# Patient Record
Sex: Female | Born: 1950 | Race: White | Hispanic: No | Marital: Married | Smoking: Never smoker
Health system: Southern US, Community
[De-identification: ages and names within clinical notes are randomized; demographics above are authoritative.]

## PROBLEM LIST (undated history)

## (undated) DIAGNOSIS — R112 Nausea with vomiting, unspecified: Secondary | ICD-10-CM

## (undated) DIAGNOSIS — M1990 Unspecified osteoarthritis, unspecified site: Secondary | ICD-10-CM

## (undated) DIAGNOSIS — I10 Essential (primary) hypertension: Secondary | ICD-10-CM

## (undated) DIAGNOSIS — E7800 Pure hypercholesterolemia, unspecified: Secondary | ICD-10-CM

## (undated) DIAGNOSIS — Z9889 Other specified postprocedural states: Secondary | ICD-10-CM

## (undated) DIAGNOSIS — C801 Malignant (primary) neoplasm, unspecified: Secondary | ICD-10-CM

## (undated) HISTORY — PX: MULTIPLE TOOTH EXTRACTIONS: SHX2053

## (undated) HISTORY — PX: COLONOSCOPY: SHX174

## (undated) HISTORY — PX: BREAST BIOPSY: SHX20

## (undated) HISTORY — PX: TUBAL LIGATION: SHX77

---

## 2017-07-29 ENCOUNTER — Other Ambulatory Visit: Payer: Self-pay | Admitting: Specialist

## 2017-07-29 DIAGNOSIS — R921 Mammographic calcification found on diagnostic imaging of breast: Secondary | ICD-10-CM

## 2017-08-03 ENCOUNTER — Ambulatory Visit
Admission: RE | Admit: 2017-08-03 | Discharge: 2017-08-03 | Disposition: A | Discharge status: Home / Self Care | Payer: Medicare Other | Source: Ambulatory Visit | Attending: Specialist | Admitting: Specialist

## 2017-08-03 DIAGNOSIS — R921 Mammographic calcification found on diagnostic imaging of breast: Secondary | ICD-10-CM

## 2017-08-07 ENCOUNTER — Other Ambulatory Visit: Payer: Self-pay | Admitting: Surgery

## 2017-08-07 ENCOUNTER — Ambulatory Visit: Payer: Self-pay | Admitting: Surgery

## 2017-08-07 DIAGNOSIS — D0512 Intraductal carcinoma in situ of left breast: Secondary | ICD-10-CM

## 2017-08-07 NOTE — H&P (Signed)
Gabriela Hill 08/07/2017 9:17 AM Location: [facility] Patient #: [id number] DOB: [birth year] Married / Language: English / Race: White Female  History of Present Illness (Ashlan Dignan A. Santonio Speakman MD; 08/07/2017 11:31 AM) Patient words: Patient presents for evaluation of abnormal mammogram. 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 She is here today to discuss options of treatment. Patient denies any history of breast pain, nipple discharge or problem with either breast.                   ADDITIONAL INFORMATION: PROGNOSTIC INDICATORS Results: IMMUNOHISTOCHEMICAL AND MORPHOMETRIC ANALYSIS PERFORMED MANUALLY Estrogen Receptor: 100%, POSITIVE, STRONG STAINING INTENSITY Progesterone Receptor: 80%, POSITIVE, STRONG STAINING INTENSITY REFERENCE RANGE ESTROGEN RECEPTOR NEGATIVE 0% POSITIVE =>1% REFERENCE RANGE PROGESTERONE RECEPTOR NEGATIVE 0% POSITIVE =>1% All controls stained appropriately Claudette Laws MD Pathologist, Electronic Signature ( Signed 08/06/2017) FINAL DIAGNOSIS Diagnosis Breast, left, needle core biopsy, lateral lower quadrant - DUCTAL CARCINOMA IN SITU, WITH CALCIFICATIONS, INTERMEDIATE GRADE. - SEE MICROSCOPIC DESCRIPTION. Microscopic Comment Estrogen and progesterone receptor will be performed. 1 of 2 FINAL for STAVROULA, ROHDE E [phone]) Microscopic Comment(continued) Dr. Tresa Moore agrees. Called to [facility] on 08/04/17. (JDP:ah 08/04/17) Claudette Laws MD Pathologist, Electronic Signature (Case signed 08/04/2017) Specimen Gross and Clinical Information Specimen Comment Calcs Specimen(s) Obtained: Breast, left, needle core biopsy, lateral lower quadrant Specimen Clinical Information DCIS vs [place] Gross Received in formalin labeled "Kirkes, Bonnita; left breast calcs lateral lower" (TIF and  CIT not provided) is a 2.5 x 1.8 x 1.0 cm aggregate of yellow-white fibrofatty tissue which is entirely submitted in two blocks with the pieces clinically displaying calcifications placed in block A. (TB:ah 08/03/17) Stain(s) used in Diagnosis: The following stain(s) were used in diagnosing the case: PR-ACIS, ER-ACIS. The control(s) stained appropriately. Disclaimer PR progesterone receptor (16), immunohistochemical stains are performed on formalin fixed, paraffin embedded tissue using a 3,3"-diaminobenzidine (DAB) chromogen and Leica Bond Autostainer System. The staining intensity of the nucleus is scored manually and is reported as the percentage of tumor cell nuclei demonstrating specific nuclear staining.Specimens are fixed in 10% Neutral Buffered Formalin for at least 6 hours and up to 72 hours. These tests have not be validated on decalcified tissue. Results should be interpreted with caution given the possibility of false negative results on decalcified specimens. Estrogen receptor (6F11), immunohistochemical stains are performed on formalin fixed, paraffin embedded tissue using a 3,3"-diaminobenzidine (DAB) chromogen and Leica Bond Autostainer System. The staining intensity of the nucleus is scored manually and is reported as the percentage of tumor cell nuclei demonstrating specific nuclear staining.Specimens are fixed in 10% Neutral Buffered Formalin for at least 6 hours and up to 72 hours. These tests have not be validated on decalcified tissue. Results should be interpreted with caution given the possibility of false negative results on decalcified specimens. Report signed out from the following location(s) Technical Component was performed at [facility]. [address],[place] [address].[id number]., Interpretation was performed at [facility] [address], [place], [place] [address]. CLIA #: [id number], 2 of 2.  The  patient is a 66 year old female.   Past Surgical History Malachy Moan, [place]; 08/07/2017 9:18 AM) Breast Biopsy Left.  Diagnostic Studies History Malachy Moan, [place]; 08/07/2017 9:18 AM) Colonoscopy 5-10 years ago Mammogram within last year Pap Smear 1-5 years ago  Allergies Malachy Moan, RMA; 08/07/2017 9:18 AM)  No Known Allergies 08/07/2017  Medication History Malachy Moan, RMA; 08/07/2017 9:18 AM) Lisinopril-Hydrochlorothiazide (10-12.5MG  Tablet, Oral) Active. Womens 50+ Multi Vitamin/Min (Oral) Active. Fish Oil (Oral) Specific strength unknown - Active. Medications Reconciled  Social History Malachy Moan, [place]; 08/07/2017 9:18 AM) Alcohol use Occasional alcohol use. Caffeine use Carbonated beverages, Coffee, Tea. No drug use Tobacco use Never smoker.  Family History Malachy Moan, [place]; 08/07/2017 9:18 AM) Cervical Cancer Sister. Diabetes Mellitus Brother, Mother, Sister. Heart disease in female family member before age 62  Pregnancy / Birth History Malachy Moan, [place]; 08/07/2017 9:18 AM) Age at menarche 24 years. Age of menopause [age] Contraceptive History Oral contraceptives. Gravida 2 Maternal age 42-20 Para 2 Regular periods  Other Problems Malachy Moan, [place]; 08/07/2017 9:18 AM) Hemorrhoids High blood pressure Hypercholesterolemia     Review of Systems Malachy Moan RMA; 08/07/2017 9:18 AM) General Not Present- Appetite Loss, Chills, Fatigue, Fever, Night Sweats, Weight Gain and Weight Loss. Skin Not Present- Change in Wart/Mole, Dryness, Hives, Jaundice, New Lesions, Non-Healing Wounds, Rash and Ulcer. HEENT Present- Wears glasses/contact lenses. Not Present- Earache, Hearing Loss, Hoarseness, Nose Bleed, Oral Ulcers, Ringing in the Ears, Seasonal Allergies, Sinus Pain, Sore Throat, Visual Disturbances and Yellow Eyes. Respiratory Present- Snoring. Not Present- Bloody sputum, Chronic Cough,  Difficulty Breathing and Wheezing. Breast Present- Breast Pain. Not Present- Breast Mass, Nipple Discharge and Skin Changes. Cardiovascular Not Present- Chest Pain, Difficulty Breathing Lying Down, Leg Cramps, Palpitations, Rapid Heart Rate, Shortness of Breath and Swelling of Extremities. Gastrointestinal Present- Hemorrhoids. Not Present- Abdominal Pain, Bloating, Bloody Stool, Change in Bowel Habits, Chronic diarrhea, Constipation, Difficulty Swallowing, Excessive gas, Gets full quickly at meals, Indigestion, Nausea, Rectal Pain and Vomiting. Female Genitourinary Not Present- Frequency, Nocturia, Painful Urination, Pelvic Pain and Urgency. Musculoskeletal Present- Joint Pain. Not Present- Back Pain, Joint Stiffness, Muscle Pain, Muscle Weakness and Swelling of Extremities. Neurological Present- Numbness. Not Present- Decreased Memory, Fainting, Headaches, Seizures, Tingling, Tremor, Trouble walking and Weakness. Psychiatric Not Present- Anxiety, Bipolar, Change in Sleep Pattern, Depression, Fearful and Frequent crying. Endocrine Not Present- Cold Intolerance, Excessive Hunger, Hair Changes, Heat Intolerance, Hot flashes and New Diabetes. Hematology Not Present- Blood Thinners, Easy Bruising, Excessive bleeding, Gland problems, HIV and Persistent Infections.  Vitals Malachy Moan RMA; 08/07/2017 9:19 AM) 08/07/2017 9:18 AM Weight: 151.6 lb Height: 61in Body Surface Area: 1.68 m Body Mass Index: 28.64 kg/m  Temp.: 97.48F  Pulse: 76 (Regular)  BP: 150/94 (Sitting, Left Arm, Standard)      Physical Exam (Aarohi Redditt A. Ronaldo Crilly MD; 08/07/2017 11:32 AM)  General Mental Status-Alert. General Appearance-Consistent with stated age. Hydration-Well hydrated. Voice-Normal.  Head and Neck Head-normocephalic, atraumatic with no lesions or palpable masses. Trachea-midline. Thyroid Gland Characteristics - normal size and consistency.  Chest and Lung Exam Chest and  lung exam reveals -quiet, even and easy respiratory effort with no use of accessory muscles and on auscultation, normal breath sounds, no adventitious sounds and normal vocal resonance. Inspection Chest Wall - Normal. Back - normal.  Breast Breast - Left-Symmetric, Non Tender, No Biopsy scars, no Dimpling, No Inflammation, No Lumpectomy scars, No Mastectomy scars, No Peau d' Orange. Breast - Right-Symmetric, Non Tender, No Biopsy scars, no Dimpling, No Inflammation, No Lumpectomy scars, No Mastectomy scars, No Peau d' Orange. Breast Lump-No Palpable Breast Mass.  Cardiovascular Cardiovascular examination reveals -normal heart sounds, regular rate and rhythm with no murmurs and normal pedal pulses bilaterally.  Neurologic Neurologic evaluation reveals -alert and oriented x 3 with no impairment of recent or remote memory.  Mental Status-Normal.  Musculoskeletal Normal Exam - Left-Upper Extremity Strength Normal and Lower Extremity Strength Normal. Normal Exam - Right-Upper Extremity Strength Normal and Lower Extremity Strength Normal.  Lymphatic Head & Neck  General Head & Neck Lymphatics: Bilateral - Description - Normal. Axillary  General Axillary Region: Bilateral - Description - Normal. Tenderness - Non Tender.    Assessment & Plan (Ishita Mcnerney A. Naina Sleeper MD; 08/07/2017 11:32 AM)  BREAST NEOPLASM, TIS (DCIS), LEFT (D05.12) Impression: Discussed medical and surgical options. Discussed the COMET trial. She has opted for left breast lumpectomy. Risk of lumpectomy include bleeding, infection, seroma, more surgery, use of seed/wire, wound care, cosmetic deformity and the need for other treatments, death , blood clots, death. Pt agrees to proceed.  Current Plans You are being scheduled for surgery- Our schedulers will call you.  You should hear from our office's scheduling department within 5 working days about the location, date, and time of surgery. We try to make  accommodations for patient's preferences in scheduling surgery, but sometimes the OR schedule or the surgeon's schedule prevents [place] from making those accommodations.  If you have not heard from our office [phone]) in 5 working days, call the office and ask for your surgeon's nurse.  If you have other questions about your diagnosis, plan, or surgery, call the office and ask for your surgeon's nurse.  Pt Education - flb breast cancer surgery: discussed with patient and provided information. We discussed the staging and pathophysiology of breast cancer. We discussed all of the different options for treatment for breast cancer including surgery, chemotherapy, radiation therapy, Herceptin, and antiestrogen therapy. We discussed a sentinel lymph node biopsy as she does not appear to having lymph node involvement right now. We discussed the performance of that with injection of radioactive tracer and blue dye. We discussed that she would have an incision underneath her axillary hairline. We discussed that there is a bout a 10-20% chance of having a positive node with a sentinel lymph node biopsy and we will await the permanent pathology to make any other first further decisions in terms of her treatment. One of these options might be to return to the operating room to perform an axillary lymph node dissection. We discussed about a 1-2% risk lifetime of chronic shoulder pain as well as lymphedema associated with a sentinel lymph node biopsy. We discussed the options for treatment of the breast cancer which included lumpectomy versus a mastectomy. We discussed the performance of the lumpectomy with a wire placement. We discussed a 10-20% chance of a positive margin requiring reexcision in the operating room. We also discussed that she may need radiation therapy or antiestrogen therapy or both if she undergoes lumpectomy. We discussed the mastectomy and the postoperative care for that as well. We discussed  that there is no difference in her survival whether she undergoes lumpectomy with radiation therapy or antiestrogen therapy versus a mastectomy. There is a slight difference in the local recurrence rate being 3-5% with lumpectomy and about 1% with a mastectomy. We discussed the risks of operation including bleeding, infection, possible reoperation. She understands her further therapy will be based on what her stages at the time of her operation.

## 2017-08-07 NOTE — H&P (View-Only) (Signed)
Gabriela Hill 08/07/2017 9:17 AM Location: [facility] Patient #: [id number] DOB: June 29, 1951 Married / Language: English / Race: White Female  History of Present Illness (Thia Olesen A. Ruqayya Ventress MD; 08/07/2017 11:31 AM) Patient words: Patient presents for evaluation of abnormal mammogram. 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 She is here today to discuss options of treatment. Patient denies any history of breast pain, nipple discharge or problem with either breast.                   ADDITIONAL INFORMATION: PROGNOSTIC INDICATORS Results: IMMUNOHISTOCHEMICAL AND MORPHOMETRIC ANALYSIS PERFORMED MANUALLY Estrogen Receptor: 100%, POSITIVE, STRONG STAINING INTENSITY Progesterone Receptor: 80%, POSITIVE, STRONG STAINING INTENSITY REFERENCE RANGE ESTROGEN RECEPTOR NEGATIVE 0% POSITIVE =>1% REFERENCE RANGE PROGESTERONE RECEPTOR NEGATIVE 0% POSITIVE =>1% All controls stained appropriately Claudette Laws MD Pathologist, Electronic Signature ( Signed 08/06/2017) FINAL DIAGNOSIS Diagnosis Breast, left, needle core biopsy, lateral lower quadrant - DUCTAL CARCINOMA IN SITU, WITH CALCIFICATIONS, INTERMEDIATE GRADE. - SEE MICROSCOPIC DESCRIPTION. Microscopic Comment Estrogen and progesterone receptor will be performed. 1 of 2 FINAL for PARUL, PORCELLI E [phone]) Microscopic Comment(continued) Dr. Tresa Moore agrees. Called to [facility] on 08/04/17. (JDP:ah 08/04/17) Claudette Laws MD Pathologist, Electronic Signature (Case signed 08/04/2017) Specimen Gross and Clinical Information Specimen Comment Calcs Specimen(s) Obtained: Breast, left, needle core biopsy, lateral lower quadrant Specimen Clinical Information DCIS vs [place] Gross Received in formalin labeled "Maiorana, Kimia; left breast calcs lateral lower" (TIF and  CIT not provided) is a 2.5 x 1.8 x 1.0 cm aggregate of yellow-white fibrofatty tissue which is entirely submitted in two blocks with the pieces clinically displaying calcifications placed in block A. (TB:ah 08/03/17) Stain(s) used in Diagnosis: The following stain(s) were used in diagnosing the case: PR-ACIS, ER-ACIS. The control(s) stained appropriately. Disclaimer PR progesterone receptor (16), immunohistochemical stains are performed on formalin fixed, paraffin embedded tissue using a 3,3"-diaminobenzidine (DAB) chromogen and Leica Bond Autostainer System. The staining intensity of the nucleus is scored manually and is reported as the percentage of tumor cell nuclei demonstrating specific nuclear staining.Specimens are fixed in 10% Neutral Buffered Formalin for at least 6 hours and up to 72 hours. These tests have not be validated on decalcified tissue. Results should be interpreted with caution given the possibility of false negative results on decalcified specimens. Estrogen receptor (6F11), immunohistochemical stains are performed on formalin fixed, paraffin embedded tissue using a 3,3"-diaminobenzidine (DAB) chromogen and Leica Bond Autostainer System. The staining intensity of the nucleus is scored manually and is reported as the percentage of tumor cell nuclei demonstrating specific nuclear staining.Specimens are fixed in 10% Neutral Buffered Formalin for at least 6 hours and up to 72 hours. These tests have not be validated on decalcified tissue. Results should be interpreted with caution given the possibility of false negative results on decalcified specimens. Report signed out from the following location(s) Technical Component was performed at [facility]. [address],[place] [address].[id number]., Interpretation was performed at [facility] [address], [place], [place] [address]. CLIA #: [id number], 2 of 2.  The  patient is a 66 year old female.   Past Surgical History Malachy Moan, [place]; 08/07/2017 9:18 AM) Breast Biopsy Left.  Diagnostic Studies History Malachy Moan, [place]; 08/07/2017 9:18 AM) Colonoscopy 5-10 years ago Mammogram within last year Pap Smear 1-5 years ago  Allergies Malachy Moan, RMA; 08/07/2017 9:18 AM)  No Known Allergies 08/07/2017  Medication History Malachy Moan, RMA; 08/07/2017 9:18 AM) Lisinopril-Hydrochlorothiazide (10-12.5MG  Tablet, Oral) Active. Womens 50+ Multi Vitamin/Min (Oral) Active. Fish Oil (Oral) Specific strength unknown - Active. Medications Reconciled  Social History Malachy Moan, [place]; 08/07/2017 9:18 AM) Alcohol use Occasional alcohol use. Caffeine use Carbonated beverages, Coffee, Tea. No drug use Tobacco use Never smoker.  Family History Malachy Moan, [place]; 08/07/2017 9:18 AM) Cervical Cancer Sister. Diabetes Mellitus Brother, Mother, Sister. Heart disease in female family member before age 45  Pregnancy / Birth History Malachy Moan, [place]; 08/07/2017 9:18 AM) Age at menarche 67 years. Age of menopause 55-60 Contraceptive History Oral contraceptives. Gravida 2 Maternal age 85-20 Para 2 Regular periods  Other Problems Malachy Moan, [place]; 08/07/2017 9:18 AM) Hemorrhoids High blood pressure Hypercholesterolemia     Review of Systems Malachy Moan RMA; 08/07/2017 9:18 AM) General Not Present- Appetite Loss, Chills, Fatigue, Fever, Night Sweats, Weight Gain and Weight Loss. Skin Not Present- Change in Wart/Mole, Dryness, Hives, Jaundice, New Lesions, Non-Healing Wounds, Rash and Ulcer. HEENT Present- Wears glasses/contact lenses. Not Present- Earache, Hearing Loss, Hoarseness, Nose Bleed, Oral Ulcers, Ringing in the Ears, Seasonal Allergies, Sinus Pain, Sore Throat, Visual Disturbances and Yellow Eyes. Respiratory Present- Snoring. Not Present- Bloody sputum, Chronic Cough,  Difficulty Breathing and Wheezing. Breast Present- Breast Pain. Not Present- Breast Mass, Nipple Discharge and Skin Changes. Cardiovascular Not Present- Chest Pain, Difficulty Breathing Lying Down, Leg Cramps, Palpitations, Rapid Heart Rate, Shortness of Breath and Swelling of Extremities. Gastrointestinal Present- Hemorrhoids. Not Present- Abdominal Pain, Bloating, Bloody Stool, Change in Bowel Habits, Chronic diarrhea, Constipation, Difficulty Swallowing, Excessive gas, Gets full quickly at meals, Indigestion, Nausea, Rectal Pain and Vomiting. Female Genitourinary Not Present- Frequency, Nocturia, Painful Urination, Pelvic Pain and Urgency. Musculoskeletal Present- Joint Pain. Not Present- Back Pain, Joint Stiffness, Muscle Pain, Muscle Weakness and Swelling of Extremities. Neurological Present- Numbness. Not Present- Decreased Memory, Fainting, Headaches, Seizures, Tingling, Tremor, Trouble walking and Weakness. Psychiatric Not Present- Anxiety, Bipolar, Change in Sleep Pattern, Depression, Fearful and Frequent crying. Endocrine Not Present- Cold Intolerance, Excessive Hunger, Hair Changes, Heat Intolerance, Hot flashes and New Diabetes. Hematology Not Present- Blood Thinners, Easy Bruising, Excessive bleeding, Gland problems, HIV and Persistent Infections.  Vitals Malachy Moan RMA; 08/07/2017 9:19 AM) 08/07/2017 9:18 AM Weight: 151.6 lb Height: 61in Body Surface Area: 1.68 m Body Mass Index: 28.64 kg/m  Temp.: 97.61F  Pulse: 76 (Regular)  BP: 150/94 (Sitting, Left Arm, Standard)      Physical Exam (Myda Detwiler A. Jayshun Galentine MD; 08/07/2017 11:32 AM)  General Mental Status-Alert. General Appearance-Consistent with stated age. Hydration-Well hydrated. Voice-Normal.  Head and Neck Head-normocephalic, atraumatic with no lesions or palpable masses. Trachea-midline. Thyroid Gland Characteristics - normal size and consistency.  Chest and Lung Exam Chest and  lung exam reveals -quiet, even and easy respiratory effort with no use of accessory muscles and on auscultation, normal breath sounds, no adventitious sounds and normal vocal resonance. Inspection Chest Wall - Normal. Back - normal.  Breast Breast - Left-Symmetric, Non Tender, No Biopsy scars, no Dimpling, No Inflammation, No Lumpectomy scars, No Mastectomy scars, No Peau d' Orange. Breast - Right-Symmetric, Non Tender, No Biopsy scars, no Dimpling, No Inflammation, No Lumpectomy scars, No Mastectomy scars, No Peau d' Orange. Breast Lump-No Palpable Breast Mass.  Cardiovascular Cardiovascular examination reveals -normal heart sounds, regular rate and rhythm with no murmurs and normal pedal pulses bilaterally.  Neurologic Neurologic evaluation reveals -alert and oriented x 3 with no impairment of recent or remote memory.  Mental Status-Normal.  Musculoskeletal Normal Exam - Left-Upper Extremity Strength Normal and Lower Extremity Strength Normal. Normal Exam - Right-Upper Extremity Strength Normal and Lower Extremity Strength Normal.  Lymphatic Head & Neck  General Head & Neck Lymphatics: Bilateral - Description - Normal. Axillary  General Axillary Region: Bilateral - Description - Normal. Tenderness - Non Tender.    Assessment & Plan (Jeanmarie Mccowen A. Kambry Takacs MD; 08/07/2017 11:32 AM)  BREAST NEOPLASM, TIS (DCIS), LEFT (D05.12) Impression: Discussed medical and surgical options. Discussed the COMET trial. She has opted for left breast lumpectomy. Risk of lumpectomy include bleeding, infection, seroma, more surgery, use of seed/wire, wound care, cosmetic deformity and the need for other treatments, death , blood clots, death. Pt agrees to proceed.  Current Plans You are being scheduled for surgery- Our schedulers will call you.  You should hear from our office's scheduling department within 5 working days about the location, date, and time of surgery. We try to make  accommodations for patient's preferences in scheduling surgery, but sometimes the OR schedule or the surgeon's schedule prevents [place] from making those accommodations.  If you have not heard from our office [phone]) in 5 working days, call the office and ask for your surgeon's nurse.  If you have other questions about your diagnosis, plan, or surgery, call the office and ask for your surgeon's nurse.  Pt Education - flb breast cancer surgery: discussed with patient and provided information. We discussed the staging and pathophysiology of breast cancer. We discussed all of the different options for treatment for breast cancer including surgery, chemotherapy, radiation therapy, Herceptin, and antiestrogen therapy. We discussed a sentinel lymph node biopsy as she does not appear to having lymph node involvement right now. We discussed the performance of that with injection of radioactive tracer and blue dye. We discussed that she would have an incision underneath her axillary hairline. We discussed that there is a bout a 10-20% chance of having a positive node with a sentinel lymph node biopsy and we will await the permanent pathology to make any other first further decisions in terms of her treatment. One of these options might be to return to the operating room to perform an axillary lymph node dissection. We discussed about a 1-2% risk lifetime of chronic shoulder pain as well as lymphedema associated with a sentinel lymph node biopsy. We discussed the options for treatment of the breast cancer which included lumpectomy versus a mastectomy. We discussed the performance of the lumpectomy with a wire placement. We discussed a 10-20% chance of a positive margin requiring reexcision in the operating room. We also discussed that she may need radiation therapy or antiestrogen therapy or both if she undergoes lumpectomy. We discussed the mastectomy and the postoperative care for that as well. We discussed  that there is no difference in her survival whether she undergoes lumpectomy with radiation therapy or antiestrogen therapy versus a mastectomy. There is a slight difference in the local recurrence rate being 3-5% with lumpectomy and about 1% with a mastectomy. We discussed the risks of operation including bleeding, infection, possible reoperation. She understands her further therapy will be based on what her stages at the time of her operation.

## 2017-08-10 NOTE — Pre-Procedure Instructions (Addendum)
Gabriela Hill  08/10/2017      [place], [place] [address] Phone: [phone] Fax: [phone]    Your procedure is scheduled on November 28  Report to [facility] at 1050 A.M.  Call this number if you have problems the morning of surgery:  [phone]   Remember:  Do not eat food or drink  after midnight .  Please complete your PRE-SURGERY ENSURE that was given to before you leave your house the morning of surgery.  Please, if able, drink it in one setting. DO NOT SIP.   Continue all medications as directed by your physician except follow these medication instructions before surgery below   Take these medicines the morning of surgery with A SIP OF WATER NONE  7 days prior to surgery STOP taking any Aspirin(unless otherwise instructed by your surgeon), Aleve, Naproxen, Ibuprofen, Motrin, Advil, Goody's, BC's, all herbal medications, fish oil, and all vitamins  Follow your doctors instructions regarding your Aspirin.  If no instructions were given by your doctor, then you will need to call the prescribing office office to get instructions.    Do not wear jewelry, make-up or nail polish.  Do not wear lotions, powders, or perfumes, or deoderant.  Do not shave 48 hours prior to surgery.  Men may shave face and neck.  Do not bring valuables to the hospital.  [facility] is not responsible for any belongings or valuables.  Contacts, dentures or bridgework may not be worn into surgery.  Leave your suitcase in the car.  After surgery it may be brought to your room.  For patients admitted to the hospital, discharge time will be determined by your treatment team.  Patients discharged the day of surgery will not be allowed to drive home.    Special instructions:   [facility]- Preparing For Surgery  Before surgery, you can play an important role. Because skin is not sterile, your  skin needs to be as free of germs as possible. You can reduce the number of germs on your skin by washing with CHG (chlorahexidine gluconate) Soap before surgery.  CHG is an antiseptic cleaner which kills germs and bonds with the skin to continue killing germs even after washing.  Please do not use if you have an allergy to CHG or antibacterial soaps. If your skin becomes reddened/irritated stop using the CHG.  Do not shave (including legs and underarms) for at least 48 hours prior to first CHG shower. It is OK to shave your face.  Please follow these instructions carefully.   1. Shower the NIGHT BEFORE SURGERY and the MORNING OF SURGERY with CHG.   2. If you chose to wash your hair, wash your hair first as usual with your normal shampoo.  3. After you shampoo, rinse your hair and body thoroughly to remove the shampoo.  4. Use CHG as you would any other liquid soap. You can apply CHG directly to the skin and wash gently with a scrungie or a clean washcloth.   5. Apply the CHG Soap to your body ONLY FROM THE NECK DOWN.  Do not use on open wounds or open sores. Avoid contact with your eyes, ears, mouth and genitals (private parts). Wash Face and genitals (private parts)  with your normal soap.  6. Wash thoroughly, paying special attention to the area where your surgery will be performed.  7. Thoroughly rinse your body  with warm water from the neck down.  8. DO NOT shower/wash with your normal soap after using and rinsing off the CHG Soap.  9. Pat yourself dry with a CLEAN TOWEL.  10. Wear CLEAN PAJAMAS to bed the night before surgery, wear comfortable clothes the morning of surgery  11. Place CLEAN SHEETS on your bed the night of your first shower and DO NOT SLEEP WITH PETS.    Day of Surgery: Do not apply any deodorants/lotions. Please wear clean clothes to the hospital/surgery center.      Please read over the following fact sheets that you were given.

## 2017-08-11 ENCOUNTER — Other Ambulatory Visit: Payer: Self-pay

## 2017-08-11 ENCOUNTER — Encounter (HOSPITAL_COMMUNITY): Payer: Self-pay | Admitting: *Deleted

## 2017-08-11 ENCOUNTER — Encounter (HOSPITAL_COMMUNITY)
Admission: RE | Admit: 2017-08-11 | Discharge: 2017-08-11 | Disposition: A | Discharge status: Home / Self Care | Payer: Medicare Other | Source: Ambulatory Visit | Attending: Surgery | Admitting: Surgery

## 2017-08-11 DIAGNOSIS — Z01818 Encounter for other preprocedural examination: Secondary | ICD-10-CM | POA: Insufficient documentation

## 2017-08-11 DIAGNOSIS — D0512 Intraductal carcinoma in situ of left breast: Secondary | ICD-10-CM | POA: Diagnosis not present

## 2017-08-11 HISTORY — DX: Pure hypercholesterolemia, unspecified: E78.00

## 2017-08-11 HISTORY — DX: Essential (primary) hypertension: I10

## 2017-08-11 HISTORY — DX: Unspecified osteoarthritis, unspecified site: M19.90

## 2017-08-11 HISTORY — DX: Malignant (primary) neoplasm, unspecified: C80.1

## 2017-08-11 HISTORY — DX: Other specified postprocedural states: R11.2

## 2017-08-11 HISTORY — DX: Other specified postprocedural states: Z98.890

## 2017-08-11 LAB — COMPREHENSIVE METABOLIC PANEL
ALT: 15 U/L (ref 14–54)
ANION GAP: 8 (ref 5–15)
AST: 20 U/L (ref 15–41)
Albumin: 4.1 g/dL (ref 3.5–5.0)
Alkaline Phosphatase: 73 U/L (ref 38–126)
BILIRUBIN TOTAL: 0.3 mg/dL (ref 0.3–1.2)
BUN: 22 mg/dL — ABNORMAL HIGH (ref 6–20)
CO2: 27 mmol/L (ref 22–32)
Calcium: 10.1 mg/dL (ref 8.9–10.3)
Chloride: 105 mmol/L (ref 101–111)
Creatinine, Ser: 0.77 mg/dL (ref 0.44–1.00)
GFR calc Af Amer: >60 mL/min (ref >60)
GLUCOSE: 91 mg/dL (ref 65–99)
POTASSIUM: 3.9 mmol/L (ref 3.5–5.1)
Sodium: 140 mmol/L (ref 135–145)
TOTAL PROTEIN: 7.2 g/dL (ref 6.5–8.1)

## 2017-08-11 LAB — CBC WITH DIFFERENTIAL/PLATELET
Basophils Absolute: 0 10*3/uL (ref 0.0–0.1)
Basophils Relative: 0 %
EOS PCT: 2 %
Eosinophils Absolute: 0.1 10*3/uL (ref 0.0–0.7)
HEMATOCRIT: 43 % (ref 36.0–46.0)
HEMOGLOBIN: 14.2 g/dL (ref 12.0–15.0)
LYMPHS ABS: 3.3 10*3/uL (ref 0.7–4.0)
LYMPHS PCT: 44 %
MCH: 29.6 pg (ref 26.0–34.0)
MCHC: 33 g/dL (ref 30.0–36.0)
MCV: 89.6 fL (ref 78.0–100.0)
Monocytes Absolute: 0.7 10*3/uL (ref 0.1–1.0)
Monocytes Relative: 9 %
NEUTROS ABS: 3.4 10*3/uL (ref 1.7–7.7)
NEUTROS PCT: 45 %
Platelets: DECREASED 10*3/uL (ref 150–400)
RBC: 4.8 MIL/uL (ref 3.87–5.11)
RDW: 12.9 % (ref 11.5–15.5)
WBC: 7.5 10*3/uL (ref 4.0–10.5)

## 2017-08-11 NOTE — Progress Notes (Signed)
PCP - Rebecka Apley - [facility] Cardiologist - denies  Chest x-ray - not needed EKG - 08/11/17 Stress Test - denies ECHO - denies Cardiac Cath - denies   Aspirin Instructions: patient stopped 08/04/17  Anesthesia review: NO  Patient denies shortness of breath, fever, cough and chest pain at PAT appointment   Patient verbalized understanding of instructions that were given to them at the PAT appointment. Patient was also instructed that they will need to review over the PAT instructions again at home before surgery.

## 2017-08-19 ENCOUNTER — Ambulatory Visit (HOSPITAL_COMMUNITY): Payer: Medicare Other | Admitting: Anesthesiology

## 2017-08-19 ENCOUNTER — Ambulatory Visit (HOSPITAL_COMMUNITY)
Admission: RE | Admit: 2017-08-19 | Discharge: 2017-08-19 | Disposition: A | Discharge status: Home / Self Care | Payer: Medicare Other | Source: Ambulatory Visit | Attending: Surgery | Admitting: Surgery

## 2017-08-19 ENCOUNTER — Encounter (HOSPITAL_COMMUNITY)
Admission: RE | Disposition: A | Discharge status: Home / Self Care | Payer: Self-pay | Source: Ambulatory Visit | Attending: Surgery

## 2017-08-19 ENCOUNTER — Encounter (HOSPITAL_COMMUNITY): Payer: Self-pay | Admitting: Critical Care Medicine

## 2017-08-19 ENCOUNTER — Ambulatory Visit
Admission: RE | Admit: 2017-08-19 | Discharge: 2017-08-19 | Disposition: A | Discharge status: Home / Self Care | Payer: Medicare Other | Source: Ambulatory Visit | Attending: Surgery | Admitting: Surgery

## 2017-08-19 DIAGNOSIS — N644 Mastodynia: Secondary | ICD-10-CM | POA: Diagnosis not present

## 2017-08-19 DIAGNOSIS — K649 Unspecified hemorrhoids: Secondary | ICD-10-CM | POA: Insufficient documentation

## 2017-08-19 DIAGNOSIS — Z8049 Family history of malignant neoplasm of other genital organs: Secondary | ICD-10-CM | POA: Insufficient documentation

## 2017-08-19 DIAGNOSIS — Z833 Family history of diabetes mellitus: Secondary | ICD-10-CM | POA: Insufficient documentation

## 2017-08-19 DIAGNOSIS — D0512 Intraductal carcinoma in situ of left breast: Secondary | ICD-10-CM

## 2017-08-19 DIAGNOSIS — I10 Essential (primary) hypertension: Secondary | ICD-10-CM | POA: Insufficient documentation

## 2017-08-19 DIAGNOSIS — Z8249 Family history of ischemic heart disease and other diseases of the circulatory system: Secondary | ICD-10-CM | POA: Diagnosis not present

## 2017-08-19 HISTORY — PX: BREAST LUMPECTOMY WITH RADIOACTIVE SEED LOCALIZATION: SHX6424

## 2017-08-19 SURGERY — BREAST LUMPECTOMY WITH RADIOACTIVE SEED LOCALIZATION
Anesthesia: General | Site: Breast | Laterality: Left

## 2017-08-19 MED ORDER — MIDAZOLAM HCL 5 MG/5ML IJ SOLN
INTRAMUSCULAR | Status: DC | PRN
  Administered 2017-08-19: 2 mg via INTRAVENOUS

## 2017-08-19 MED ORDER — EPHEDRINE SULFATE-NACL 50-0.9 MG/10ML-% IV SOSY
PREFILLED_SYRINGE | INTRAVENOUS | Status: DC | PRN
  Administered 2017-08-19 (×2): 5 mg via INTRAVENOUS

## 2017-08-19 MED ORDER — CEFAZOLIN SODIUM-DEXTROSE 2-4 GM/100ML-% IV SOLN
2.0000 g | INTRAVENOUS | Status: AC
  Administered 2017-08-19: 2 g via INTRAVENOUS
  Filled 2017-08-19: qty 100

## 2017-08-19 MED ORDER — 0.9 % SODIUM CHLORIDE (POUR BTL) OPTIME
TOPICAL | Status: DC | PRN
  Administered 2017-08-19: 1000 mL

## 2017-08-19 MED ORDER — BUPIVACAINE-EPINEPHRINE 0.25% -1:200000 IJ SOLN
INTRAMUSCULAR | Status: DC | PRN
  Administered 2017-08-19: 10 mL

## 2017-08-19 MED ORDER — DEXAMETHASONE SODIUM PHOSPHATE 10 MG/ML IJ SOLN
INTRAMUSCULAR | Status: DC | PRN
  Administered 2017-08-19: 10 mg via INTRAVENOUS

## 2017-08-19 MED ORDER — FENTANYL CITRATE (PF) 250 MCG/5ML IJ SOLN
INTRAMUSCULAR | Status: DC | PRN
  Administered 2017-08-19: 50 ug via INTRAVENOUS
  Administered 2017-08-19: 25 ug via INTRAVENOUS

## 2017-08-19 MED ORDER — FENTANYL CITRATE (PF) 100 MCG/2ML IJ SOLN: 25.0000 ug | INTRAMUSCULAR | Status: DC | PRN

## 2017-08-19 MED ORDER — BUPIVACAINE-EPINEPHRINE (PF) 0.25% -1:200000 IJ SOLN
INTRAMUSCULAR | Status: AC
  Filled 2017-08-19: qty 30

## 2017-08-19 MED ORDER — OXYCODONE HCL 5 MG PO TABS: 5.0000 mg | ORAL_TABLET | Freq: Four times a day (QID) | ORAL | 0 refills | Status: AC | PRN

## 2017-08-19 MED ORDER — IBUPROFEN 800 MG PO TABS: 800.0000 mg | ORAL_TABLET | Freq: Three times a day (TID) | ORAL | 0 refills | Status: AC | PRN

## 2017-08-19 MED ORDER — DEXTROSE 5 % IV SOLN
3.0000 g | INTRAVENOUS | Status: DC
  Filled 2017-08-19: qty 3000

## 2017-08-19 MED ORDER — FENTANYL CITRATE (PF) 250 MCG/5ML IJ SOLN
INTRAMUSCULAR | Status: AC
  Filled 2017-08-19: qty 5

## 2017-08-19 MED ORDER — CELECOXIB 200 MG PO CAPS
ORAL_CAPSULE | ORAL | Status: AC
  Filled 2017-08-19: qty 1

## 2017-08-19 MED ORDER — GABAPENTIN 300 MG PO CAPS
ORAL_CAPSULE | ORAL | Status: AC
  Filled 2017-08-19: qty 1

## 2017-08-19 MED ORDER — MIDAZOLAM HCL 2 MG/2ML IJ SOLN
INTRAMUSCULAR | Status: AC
  Filled 2017-08-19: qty 2

## 2017-08-19 MED ORDER — PROPOFOL 10 MG/ML IV BOLUS
INTRAVENOUS | Status: AC
  Filled 2017-08-19: qty 20

## 2017-08-19 MED ORDER — LACTATED RINGERS IV SOLN
INTRAVENOUS | Status: DC
  Administered 2017-08-19: 50 mL/h via INTRAVENOUS

## 2017-08-19 MED ORDER — LIDOCAINE 2% (20 MG/ML) 5 ML SYRINGE
INTRAMUSCULAR | Status: DC | PRN
  Administered 2017-08-19: 100 mg via INTRAVENOUS

## 2017-08-19 MED ORDER — PROPOFOL 10 MG/ML IV BOLUS
INTRAVENOUS | Status: DC | PRN
  Administered 2017-08-19: 140 mg via INTRAVENOUS

## 2017-08-19 MED ORDER — CHLORHEXIDINE GLUCONATE CLOTH 2 % EX PADS: 6.0000 | MEDICATED_PAD | Freq: Once | CUTANEOUS | Status: DC

## 2017-08-19 MED ORDER — ACETAMINOPHEN 500 MG PO TABS
1000.0000 mg | ORAL_TABLET | ORAL | Status: AC
  Administered 2017-08-19: 1000 mg via ORAL

## 2017-08-19 MED ORDER — CELECOXIB 200 MG PO CAPS
200.0000 mg | ORAL_CAPSULE | ORAL | Status: AC
  Administered 2017-08-19: 200 mg via ORAL

## 2017-08-19 MED ORDER — GABAPENTIN 300 MG PO CAPS
300.0000 mg | ORAL_CAPSULE | ORAL | Status: AC
  Administered 2017-08-19: 300 mg via ORAL

## 2017-08-19 MED ORDER — ONDANSETRON HCL 4 MG/2ML IJ SOLN
INTRAMUSCULAR | Status: DC | PRN
  Administered 2017-08-19: 4 mg via INTRAVENOUS

## 2017-08-19 MED ORDER — ACETAMINOPHEN 500 MG PO TABS
ORAL_TABLET | ORAL | Status: AC
  Filled 2017-08-19: qty 2

## 2017-08-19 SURGICAL SUPPLY — 50 items
APPLIER CLIP 9.375 MED OPEN (MISCELLANEOUS)
BINDER BREAST LRG (GAUZE/BANDAGES/DRESSINGS) IMPLANT
BINDER BREAST XLRG (GAUZE/BANDAGES/DRESSINGS) IMPLANT
BLADE SURG 15 STRL LF DISP TIS (BLADE) ×1 IMPLANT
BLADE SURG 15 STRL SS (BLADE) ×2
CANISTER SUCT 3000ML PPV (MISCELLANEOUS) IMPLANT
CHLORAPREP W/TINT 26ML (MISCELLANEOUS) ×3 IMPLANT
CLIP APPLIE 9.375 MED OPEN (MISCELLANEOUS) IMPLANT
COVER LIGHT HANDLE STERIS (MISCELLANEOUS) ×3 IMPLANT
COVER PROBE W GEL 5X96 (DRAPES) ×3 IMPLANT
DERMABOND ADVANCED (GAUZE/BANDAGES/DRESSINGS) ×2
DERMABOND ADVANCED .7 DNX12 (GAUZE/BANDAGES/DRESSINGS) ×1 IMPLANT
DEVICE DUBIN SPECIMEN MAMMOGRA (MISCELLANEOUS) ×3 IMPLANT
DRAPE CHEST BREAST 15X10 FENES (DRAPES) ×3 IMPLANT
DRAPE UTILITY XL STRL (DRAPES) ×3 IMPLANT
ELECT CAUTERY BLADE 6.4 (BLADE) ×3 IMPLANT
ELECT REM PT RETURN 9FT ADLT (ELECTROSURGICAL) ×3
ELECTRODE REM PT RTRN 9FT ADLT (ELECTROSURGICAL) ×1 IMPLANT
GLOVE BIO SURGEON STRL SZ8 (GLOVE) ×3 IMPLANT
GLOVE BIOGEL PI IND STRL 6 (GLOVE) ×1 IMPLANT
GLOVE BIOGEL PI IND STRL 8 (GLOVE) ×1 IMPLANT
GLOVE BIOGEL PI INDICATOR 6 (GLOVE) ×2
GLOVE BIOGEL PI INDICATOR 8 (GLOVE) ×2
GLOVE ECLIPSE 6.0 STRL STRAW (GLOVE) ×3 IMPLANT
GOWN STRL REUS W/ TWL LRG LVL3 (GOWN DISPOSABLE) ×1 IMPLANT
GOWN STRL REUS W/ TWL XL LVL3 (GOWN DISPOSABLE) ×1 IMPLANT
GOWN STRL REUS W/TWL LRG LVL3 (GOWN DISPOSABLE) ×2
GOWN STRL REUS W/TWL XL LVL3 (GOWN DISPOSABLE) ×2
KIT BASIN OR (CUSTOM PROCEDURE TRAY) ×3 IMPLANT
KIT MARKER MARGIN INK (KITS) ×3 IMPLANT
LIGHT WAVEGUIDE WIDE FLAT (MISCELLANEOUS) ×3 IMPLANT
NEEDLE HYPO 25GX1X1/2 BEV (NEEDLE) ×3 IMPLANT
NS IRRIG 1000ML POUR BTL (IV SOLUTION) IMPLANT
PACK SURGICAL SETUP 50X90 (CUSTOM PROCEDURE TRAY) ×3 IMPLANT
PENCIL BUTTON HOLSTER BLD 10FT (ELECTRODE) ×3 IMPLANT
SPONGE LAP 18X18 X RAY DECT (DISPOSABLE) ×3 IMPLANT
SUT MNCRL AB 4-0 PS2 18 (SUTURE) ×3 IMPLANT
SUT SILK 2 0 SH (SUTURE) IMPLANT
SUT VIC AB 2-0 SH 27 (SUTURE) ×2
SUT VIC AB 2-0 SH 27XBRD (SUTURE) ×1 IMPLANT
SUT VIC AB 3-0 SH 27 (SUTURE) ×2
SUT VIC AB 3-0 SH 27X BRD (SUTURE) ×1 IMPLANT
SYR BULB 3OZ (MISCELLANEOUS) ×3 IMPLANT
SYR BULB IRRIGATION 50ML (SYRINGE) ×3 IMPLANT
SYR CONTROL 10ML LL (SYRINGE) ×3 IMPLANT
TOWEL OR 17X24 6PK STRL BLUE (TOWEL DISPOSABLE) ×3 IMPLANT
TOWEL OR 17X26 10 PK STRL BLUE (TOWEL DISPOSABLE) ×3 IMPLANT
TUBE CONNECTING 12'X1/4 (SUCTIONS) ×1
TUBE CONNECTING 12X1/4 (SUCTIONS) ×2 IMPLANT
YANKAUER SUCT BULB TIP NO VENT (SUCTIONS) ×3 IMPLANT

## 2017-08-19 NOTE — Anesthesia Postprocedure Evaluation (Signed)
Anesthesia Post Note  Patient: Gabriela Hill  Procedure(s) Performed: BREAST LUMPECTOMY WITH RADIOACTIVE SEED LOCALIZATION (Left Breast)     Patient location during evaluation: PACU Anesthesia Type: General Level of consciousness: awake and alert Pain management: pain level controlled Vital Signs Assessment: post-procedure vital signs reviewed and stable Respiratory status: spontaneous breathing, nonlabored ventilation, respiratory function stable and patient connected to nasal cannula oxygen Cardiovascular status: blood pressure returned to baseline and stable Postop Assessment: no apparent nausea or vomiting Anesthetic complications: no    Last Vitals:  Vitals:   08/19/17 1415 08/19/17 1430  BP: 135/76 130/69  Pulse: 73 69  Resp: 15 19  Temp: (!) 36.3 C   SpO2: 93% 97%    Last Pain:  Vitals:   08/19/17 1430  TempSrc:   PainSc: 0-No pain                 Carrolyn Hilmes EDWARD

## 2017-08-19 NOTE — Discharge Instructions (Signed)
[facility] °Office Phone Number [phone] ° °BREAST BIOPSY/ PARTIAL MASTECTOMY: POST OP INSTRUCTIONS ° °Always review your discharge instruction sheet given to you by the facility where your surgery was performed. ° °IF YOU HAVE DISABILITY OR FAMILY LEAVE FORMS, YOU MUST BRING THEM TO THE OFFICE FOR PROCESSING.  DO NOT GIVE THEM TO YOUR DOCTOR. ° °1. A prescription for pain medication may be given to you upon discharge.  Take your pain medication as prescribed, if needed.  If narcotic pain medicine is not needed, then you may take acetaminophen (Tylenol) or ibuprofen (Advil) as needed. °2. Take your usually prescribed medications unless otherwise directed °3. If you need a refill on your pain medication, please contact your pharmacy.  They will contact our office to request authorization.  Prescriptions will not be filled after 5pm or on week-ends. °4. You should eat very light the first 24 hours after surgery, such as soup, crackers, pudding, etc.  Resume your normal diet the day after surgery. °5. Most patients will experience some swelling and bruising in the breast.  Ice packs and a good support bra will help.  Swelling and bruising can take several days to resolve.  °6. It is common to experience some constipation if taking pain medication after surgery.  Increasing fluid intake and taking a stool softener will usually help or prevent this problem from occurring.  A mild laxative (Milk of Magnesia or Miralax) should be taken according to package directions if there are no bowel movements after 48 hours. °7. Unless discharge instructions indicate otherwise, you may remove your bandages 24-48 hours after surgery, and you may shower at that time.  You may have steri-strips (small skin tapes) in place directly over the incision.  These strips should be left on the skin for 7-10 days.  If your surgeon used skin glue on the incision, you may shower in 24 hours.  The glue will flake off over the  next 2-3 weeks.  Any sutures or staples will be removed at the office during your follow-up visit. °8. ACTIVITIES:  You may resume regular daily activities (gradually increasing) beginning the next day.  Wearing a good support bra or sports bra minimizes pain and swelling.  You may have sexual intercourse when it is comfortable. °a. You may drive when you no longer are taking prescription pain medication, you can comfortably wear a seatbelt, and you can safely maneuver your car and apply brakes. °b. RETURN TO WORK:  ______________________________________________________________________________________ °9. You should see your doctor in the office for a follow-up appointment approximately two weeks after your surgery.  Your doctor’s nurse will typically make your follow-up appointment when she calls you with your pathology report.  Expect your pathology report 2-3 business days after your surgery.  You may call to check if you do not hear from us after three days. °10. OTHER INSTRUCTIONS: _______________________________________________________________________________________________ _____________________________________________________________________________________________________________________________________ °_____________________________________________________________________________________________________________________________________ °_____________________________________________________________________________________________________________________________________ ° °WHEN TO CALL YOUR DOCTOR: °1. Fever over 101.0 °2. Nausea and/or vomiting. °3. Extreme swelling or bruising. °4. Continued bleeding from incision. °5. Increased pain, redness, or drainage from the incision. ° °The clinic staff is available to answer your questions during regular business hours.  Please don’t hesitate to call and ask to speak to one of the nurses for clinical concerns.  If you have a medical emergency, go to the nearest  emergency room or call 911.  A surgeon from [facility] is always on call at the hospital. ° °For further questions, please visit [email or web address]  °

## 2017-08-19 NOTE — Transfer of Care (Signed)
Immediate Anesthesia Transfer of Care Note  Patient: Gabriela Hill  Procedure(s) Performed: BREAST LUMPECTOMY WITH RADIOACTIVE SEED LOCALIZATION (Left Breast)  Patient Location: PACU  Anesthesia Type:General  Level of Consciousness: awake and alert   Airway & Oxygen Therapy: Patient Spontanous Breathing and Patient connected to nasal cannula oxygen  Post-op Assessment: Report given to RN and Post -op Vital signs reviewed and stable  Post vital signs: Reviewed and stable  Last Vitals:  Vitals:   08/19/17 0933  BP: (!) 136/56  Pulse: 72  Resp: 20  Temp: (!) 36.3 C  SpO2: 98%    Last Pain:  Vitals:   08/19/17 1007  TempSrc:   PainSc: 0-No pain      Patients Stated Pain Goal: 2 (57/90/38 [id number])  Complications: No apparent anesthesia complications

## 2017-08-19 NOTE — Op Note (Signed)
Preoperative diagnosis: Left breast DCIS  Postoperative diagnosis: Same  Procedure: Left breast feed localize partial mastectomy  Surgeon: Thomas Cornett M.D.  Anesthesia: LMA with local  EBL: 30 mL  Drains: None  Specimen: Left breast tissue is seen in clinic verified that radiograph sent to pathology  Indications for procedure: The patient presents for treatment of left breast DCIS diagnosed by mammography. Cord 5 tissue 100 calcifications which are atypical and DCIS. Discussed options although mastectomy versus lumpectomy. The patient opted for breast conservation. The procedure has been discussed with the patient. Alternatives to surgery have been discussed with the patient.  Risks of surgery include bleeding,  Infection,  Seroma formation, death,  and the need for further surgery.   The patient understands and wishes to proceed.    Description of procedure: The patient was met known area. Left breast was marked as correct side x-rays were reviewed prior the procedure. She sent back to Her room placed on the OR table. After induction of LMA anesthesia, left breast was prepped and draped in sterile fashion.  Timeout was done to verify proper patient, side and procedure. Mammograms available for review. Incision made on the lateral border of the nipple areolar complex.  Neoprobe used to identify the tissue to excise. ALL Tissue around the seed and  clip were excised with a gross negative margin.   Hemostasis achieved with cautery.  Clips used to mark the cavity.  Hemostasis achieved.  Wound closed with 3 0 vicryl and 4 0 monocryl.  Dermabond applied.  All final counts correct.  The patient was awoke and taken to recovery in satisfactory condition.    

## 2017-08-19 NOTE — Anesthesia Procedure Notes (Signed)
Procedure Name: LMA Insertion Date/Time: 08/19/2017 12:03 PM Performed by: Wilburn Cornelia, CRNA Pre-anesthesia Checklist: Patient identified, Emergency Drugs available, Suction available, Patient being monitored and Timeout performed Patient Re-evaluated:Patient Re-evaluated prior to induction Oxygen Delivery Method: Circle system utilized Preoxygenation: Pre-oxygenation with 100% oxygen Induction Type: IV induction Ventilation: Mask ventilation without difficulty LMA: LMA inserted LMA Size: 4.0 Number of attempts: 1 Placement Confirmation: positive ETCO2,  CO2 detector and breath sounds checked- equal and bilateral Tube secured with: Tape Dental Injury: Teeth and Oropharynx as per pre-operative assessment

## 2017-08-19 NOTE — Interval H&P Note (Signed)
History and Physical Interval Note:  08/19/2017 11:34 AM  Gabriela Hill  has presented today for surgery, with the diagnosis of LEFT BREAST DCIS  The various methods of treatment have been discussed with the patient and family. After consideration of risks, benefits and other options for treatment, the patient has consented to  Procedure(s): BREAST LUMPECTOMY WITH RADIOACTIVE SEED LOCALIZATION (Left) as a surgical intervention .  The patient's history has been reviewed, patient examined, no change in status, stable for surgery.  I have reviewed the patient's chart and labs.  Questions were answered to the patient's satisfaction.     [place]

## 2017-08-19 NOTE — Interval H&P Note (Signed)
History and Physical Interval Note:  08/19/2017 11:47 AM  Gabriela Hill  has presented today for surgery, with the diagnosis of LEFT BREAST DCIS  The various methods of treatment have been discussed with the patient and family. After consideration of risks, benefits and other options for treatment, the patient has consented to  Procedure(s): BREAST LUMPECTOMY WITH RADIOACTIVE SEED LOCALIZATION (Left) as a surgical intervention .  The patient's history has been reviewed, patient examined, no change in status, stable for surgery.  I have reviewed the patient's chart and labs.  Questions were answered to the patient's satisfaction.     [place]

## 2017-08-19 NOTE — Anesthesia Preprocedure Evaluation (Addendum)
Anesthesia Evaluation  Patient identified by MRN, date of birth, ID band Patient awake    Reviewed: Allergy & Precautions, H&P , Patient's Chart, lab work & pertinent test results, reviewed documented beta blocker date and time   History of Anesthesia Complications (+) PONV  Airway Mallampati: II  TM Distance: >3 FB Neck ROM: full    Dental no notable dental hx.    Pulmonary    Pulmonary exam normal breath sounds clear to auscultation       Cardiovascular hypertension,  Rhythm:regular Rate:Normal     Neuro/Psych    GI/Hepatic   Endo/Other    Renal/GU      Musculoskeletal   Abdominal   Peds  Hematology   Anesthesia Other Findings   Reproductive/Obstetrics                            Anesthesia Physical Anesthesia Plan  ASA: II  Anesthesia Plan: General   Post-op Pain Management:    Induction: Intravenous  PONV Risk Score and Plan: 3 and Dexamethasone, Ondansetron and Treatment may vary due to age or medical condition  Airway Management Planned: LMA  Additional Equipment:   Intra-op Plan:   Post-operative Plan:   Informed Consent: I have reviewed the patients History and Physical, chart, labs and discussed the procedure including the risks, benefits and alternatives for the proposed anesthesia with the patient or authorized representative who has indicated his/her understanding and acceptance.   Dental Advisory Given  Plan Discussed with: CRNA and Surgeon  Anesthesia Plan Comments: ( )       Anesthesia Quick Evaluation

## 2017-08-20 ENCOUNTER — Encounter (HOSPITAL_COMMUNITY): Payer: Self-pay | Admitting: Surgery

## 2018-06-10 ENCOUNTER — Encounter (INDEPENDENT_AMBULATORY_CARE_PROVIDER_SITE_OTHER): Payer: Self-pay | Admitting: *Deleted

## 2018-10-12 IMAGING — MG STEREOTACTIC CORE NEEDLE BIOPSY
8 of 12 series · 8 of 12 positions shown · non-contrast
Comparison: Previous exams.

ADDENDUM:
Pathology revealed INTERMEDIATE GRADE DUCTAL CARCINOMA IN SITU, WITH
CALCIFICATIONS of the Left breast, lateral lower quadrant. This was
found to be concordant by Dr. Nosakhe Vince. Pathology results were
discussed with the patient by telephone. The patient reported doing
well after the biopsy with tenderness at the site. Post biopsy
instructions and care were reviewed and questions were answered. The
patient was encouraged to call [REDACTED] for any additional concerns. Surgical consultation has been
arranged with Dr. Lisset Engels at [[email or web address]], per
patient request, on August 07, 2017.

Pathology results reported by Schnayder Kel, RN on 08/04/2017.
CLINICAL DATA: Suspicious calcifications left breast for biopsy
EXAM:
LEFT BREAST STEREOTACTIC CORE NEEDLE BIOPSY

[L (1 of 8)]
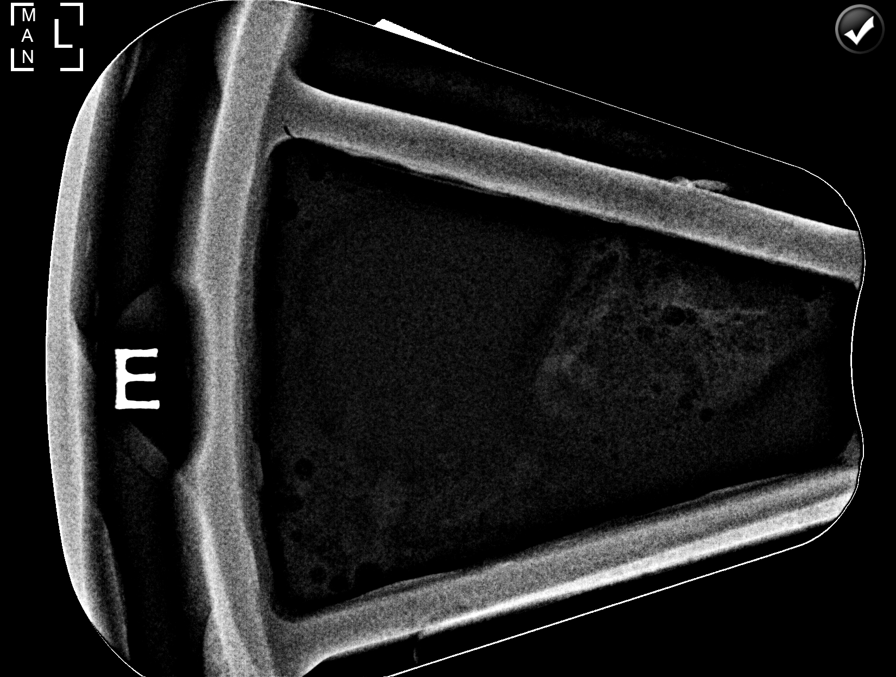

[L (2 of 8)]
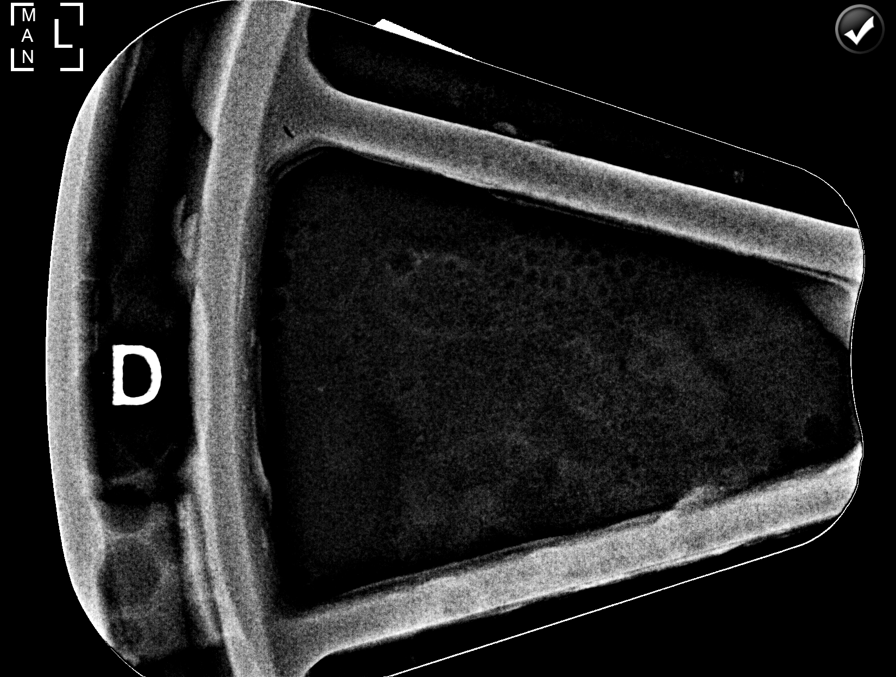

[L (3 of 8)]
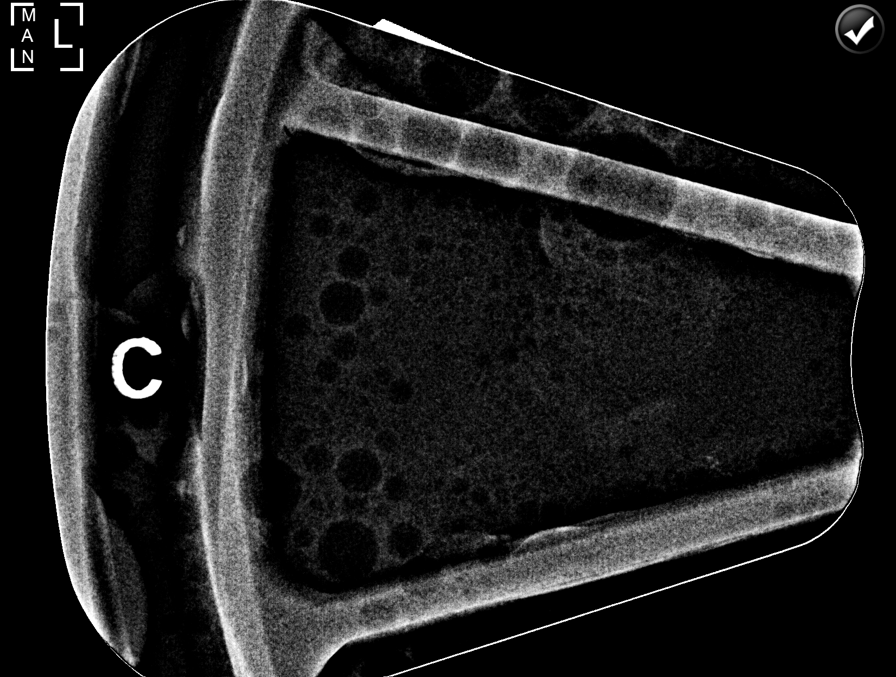

[L (4 of 8)]
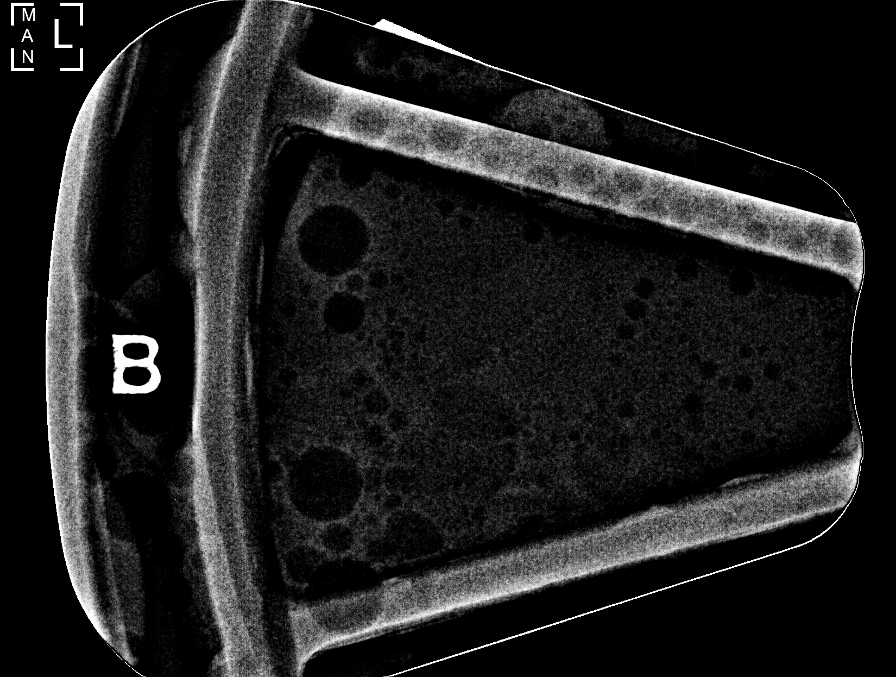

[L (5 of 8)]
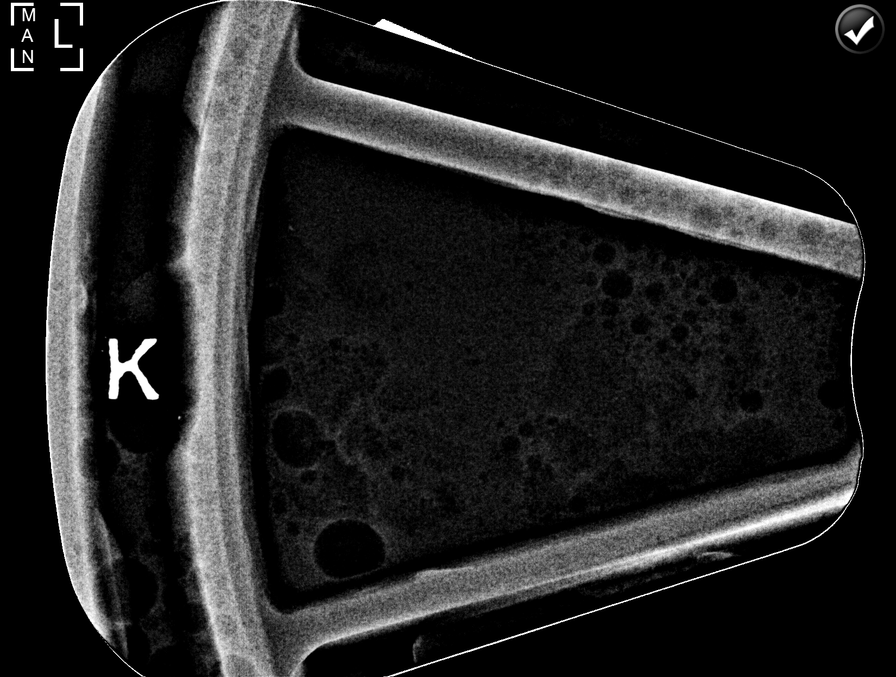

[L (6 of 8)]
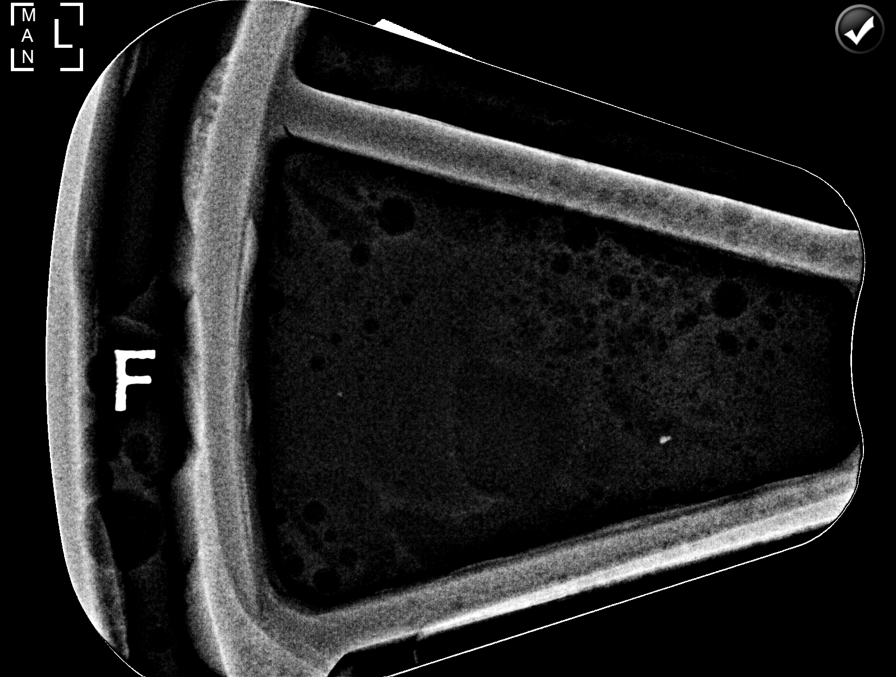

[L (7 of 8)]
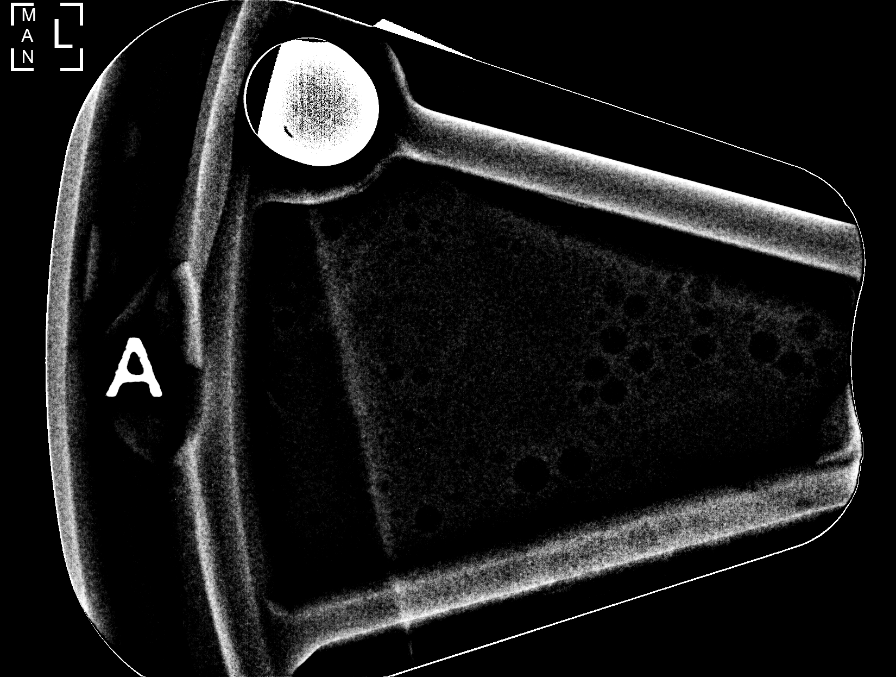

[L (8 of 8)]
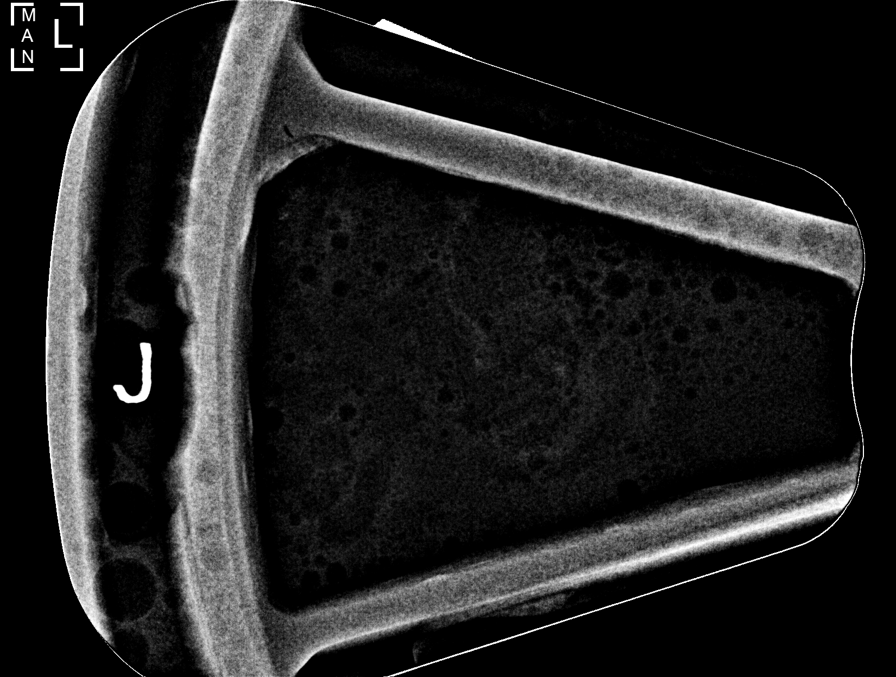

[8 of 12 positions shown; findings below may reference images not displayed]



Using sterile technique and 1% Lidocaine as local anesthetic, under
stereotactic guidance, a 9 gauge vacuum assisted device was used to
perform core needle biopsy of calcifications in the lower lateral
quadrant of the left breast using a lateral approach. Specimen
radiograph was performed showing inclusion a calcifications of
concern. Specimens with calcifications are identified for pathology.

Lesion quadrant: Lateral lower quadrant

At the conclusion of the procedure, a coil tissue marker clip was
deployed into the biopsy cavity. Follow-up 2-view mammogram was
performed and dictated separately.
IMPRESSION: Stereotactic-guided biopsy of left breast. No apparent
complications.

## 2018-10-28 IMAGING — MG NEEDLE LOCALIZATION OF THE LEFT BREAST WITH MAMMO GUIDANCE
3 series · 3 of 3 positions shown · non-contrast
Comparison: Previous exam(s).

CLINICAL DATA: 66-year-old female with recently diagnosed left
breast ductal carcinoma in situ presents for radioactive seed
localization.

EXAM:
MAMMOGRAPHIC GUIDED RADIOACTIVE SEED LOCALIZATION OF THE LEFT BREAST

[L LM]
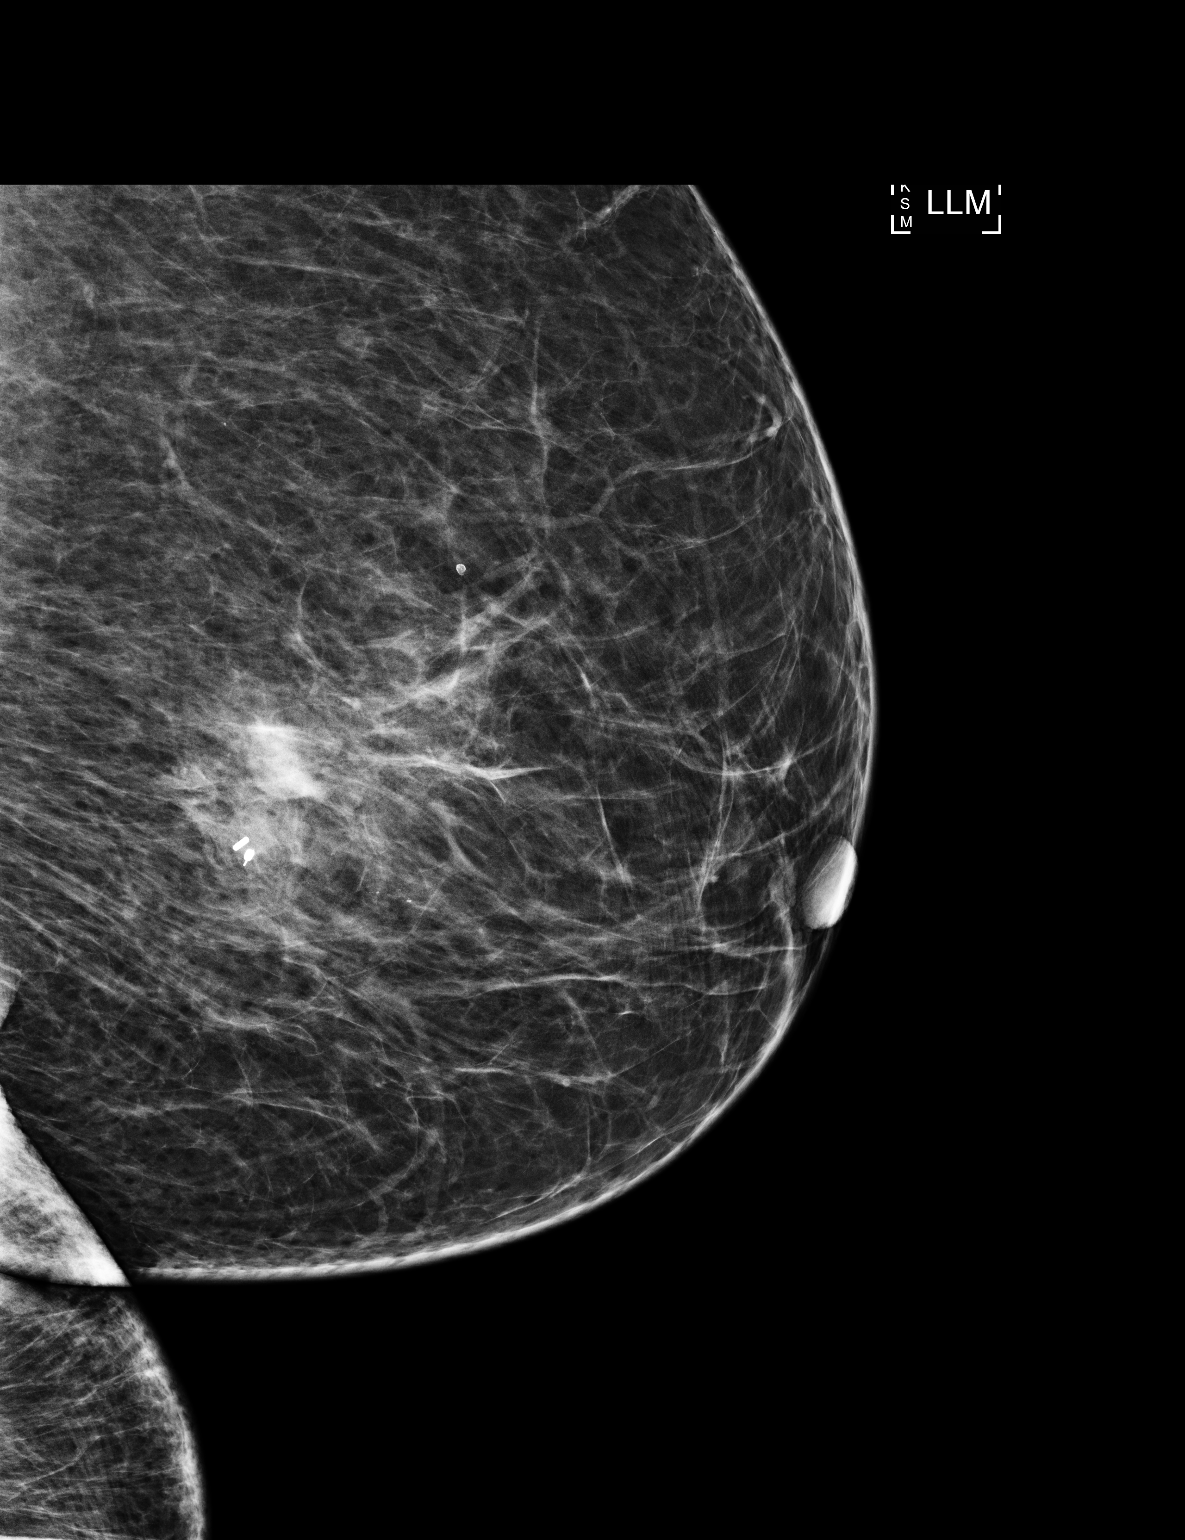

[L CC (1 of 2)]
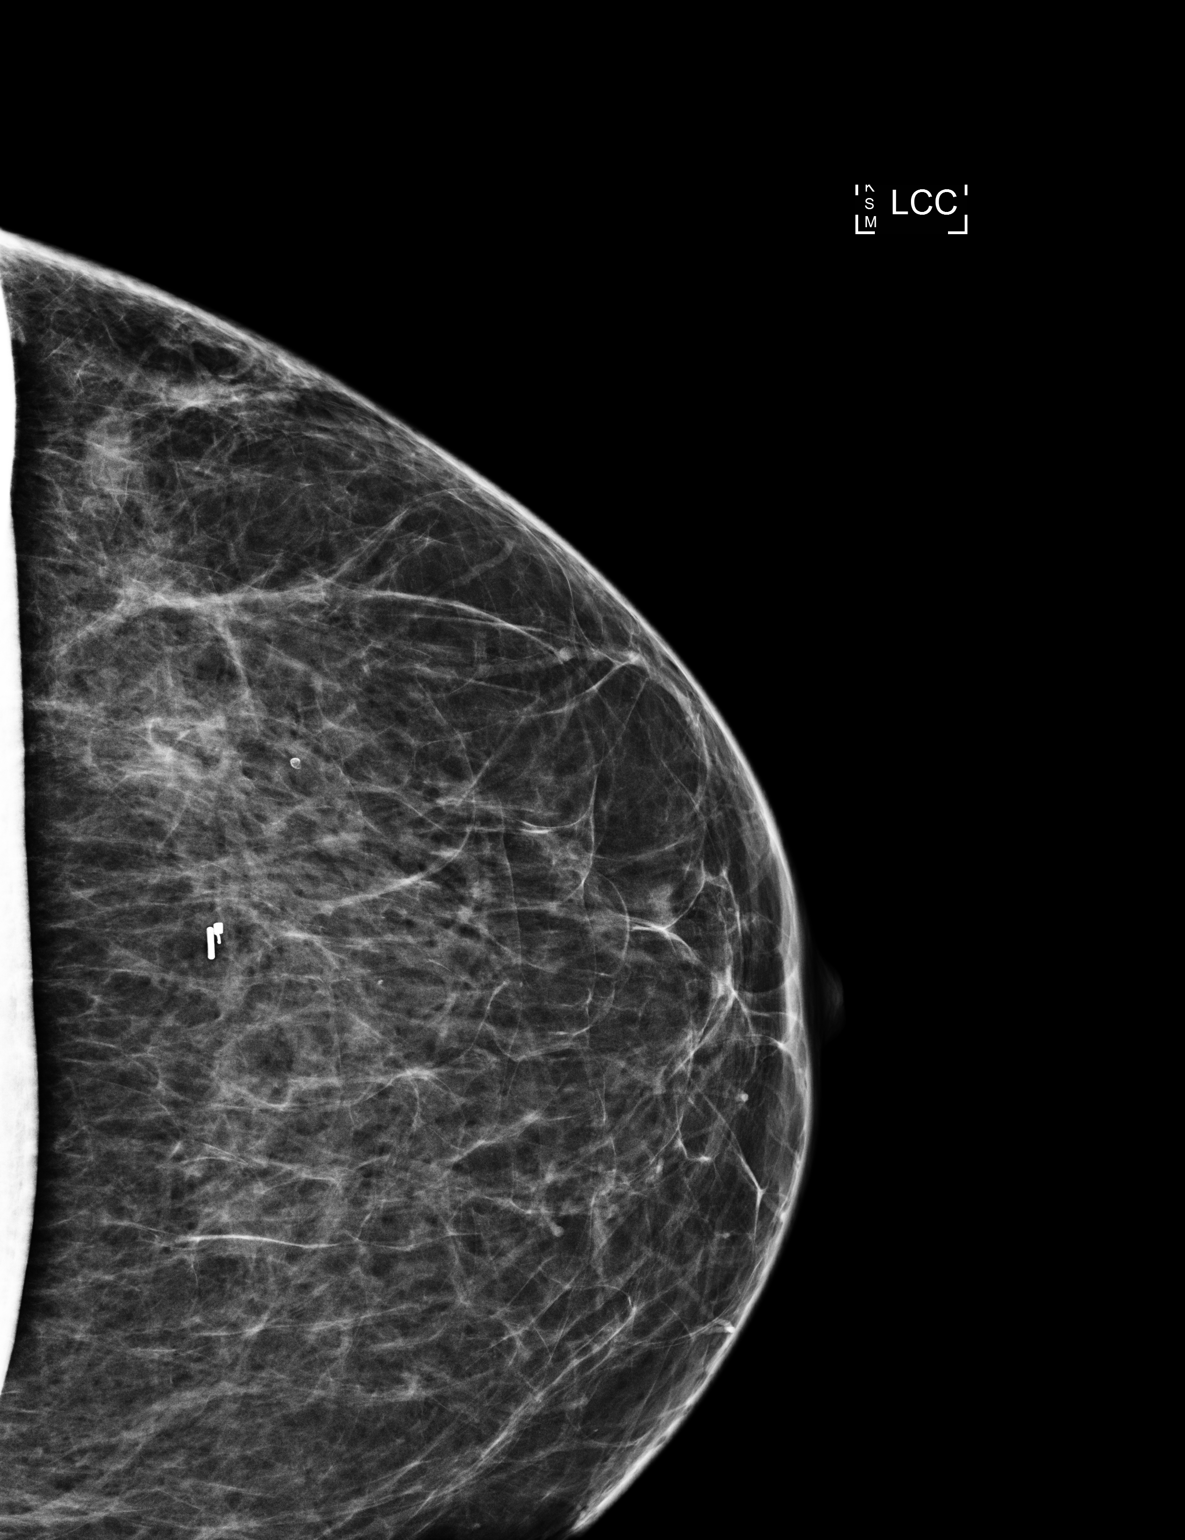

[L CC (2 of 2)]
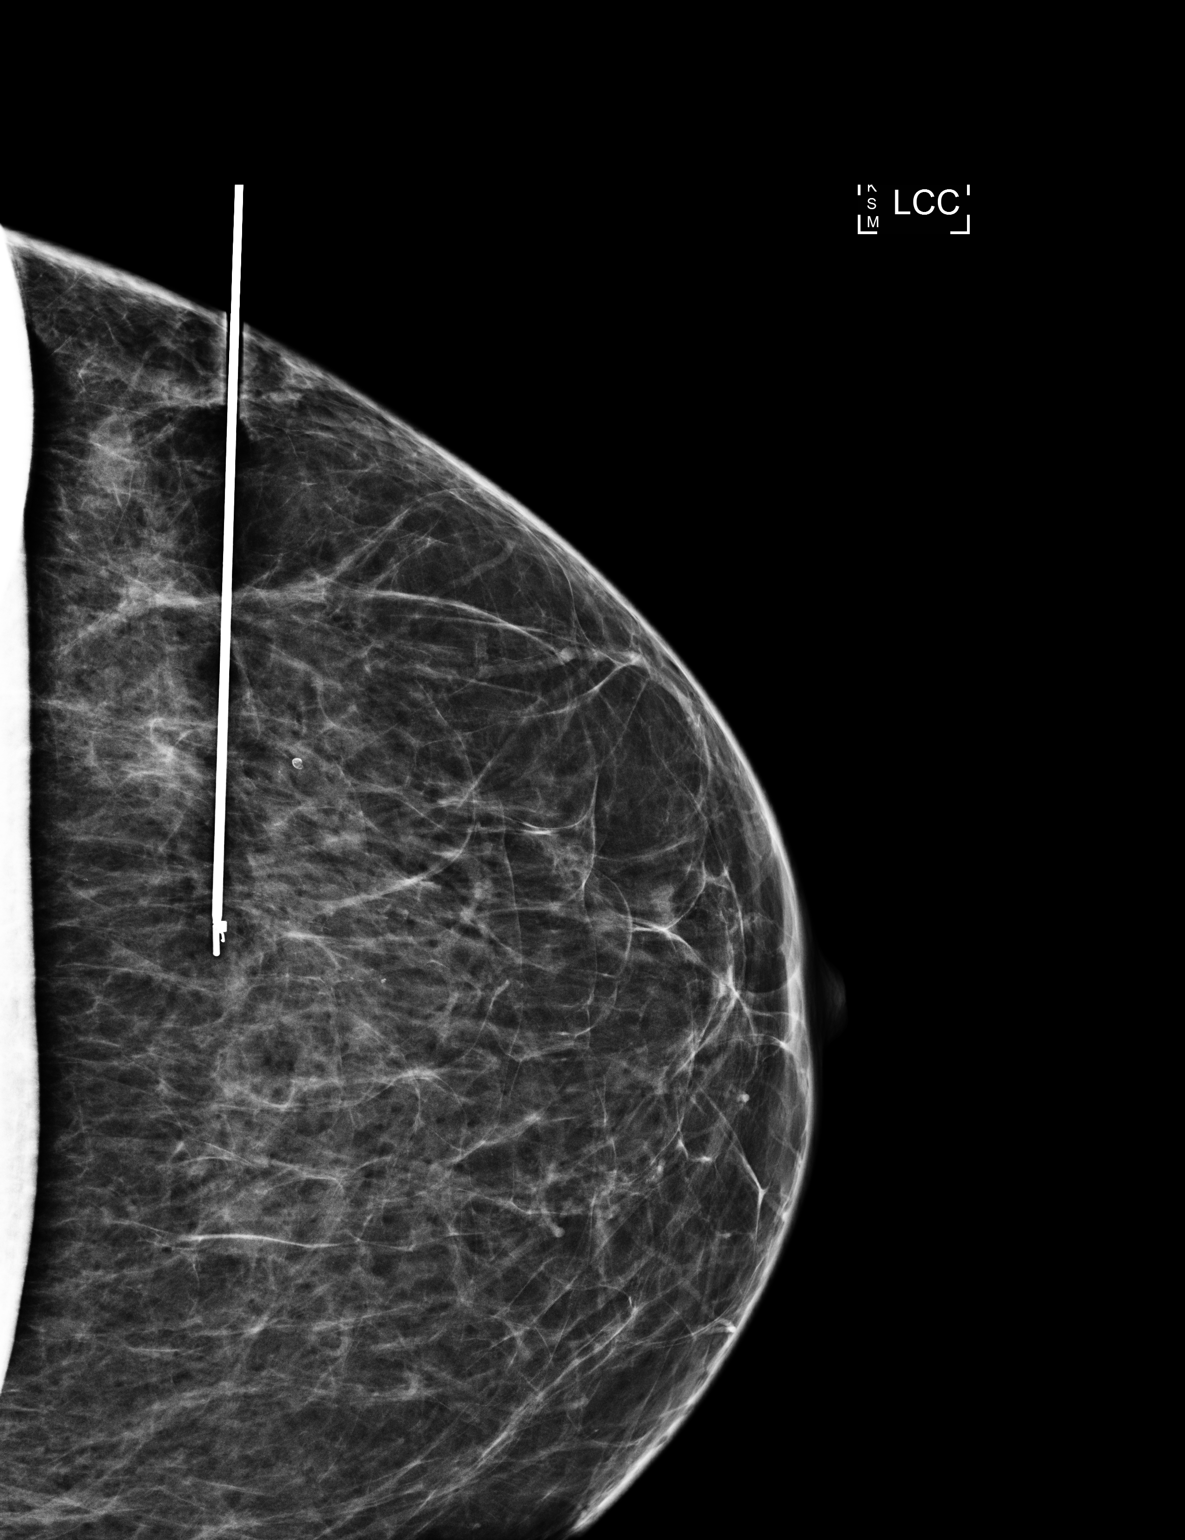

[3 of 3 positions shown; findings below may reference images not displayed]

FINDINGS: Patient presents for radioactive seed localization prior to left
breast lumpectomy. I met with the patient and we discussed the
procedure of seed localization including benefits and alternatives.
We discussed the high likelihood of a successful procedure. We
discussed the risks of the procedure including infection, bleeding,
tissue injury and further surgery. We discussed the low dose of
radioactivity involved in the procedure. Informed, written consent
was given.

The usual time-out protocol was performed immediately prior to the
procedure.

Using mammographic guidance, sterile technique, 1% lidocaine and an
[id number] radioactive seed, the coil shaped biopsy marking clip with
adjacent calcifications was localized using a lateral to medial
approach. The follow-up mammogram images confirm the seed in the
expected location and were marked for Dr. Tabita.

Follow-up survey of the patient confirms presence of the radioactive
seed.

Order number of [id number] seed:  [id number].

Total activity:  0.246 millicuries  Reference Date: 08/07/2017

The patient tolerated the procedure well and was released from the
[REDACTED]. She was given instructions regarding seed removal.
IMPRESSION: Radioactive seed localization left breast. No apparent
complications.
